# Patient Record
Sex: Female | Born: 1995 | Race: White | Hispanic: No | Marital: Single | Smoking: Never smoker
Health system: Southern US, Community
[De-identification: ages and names within clinical notes are randomized; demographics above are authoritative.]

## PROBLEM LIST (undated history)

## (undated) DIAGNOSIS — Z973 Presence of spectacles and contact lenses: Secondary | ICD-10-CM

## (undated) HISTORY — PX: NO PAST SURGERIES: SHX2092

---

## 2015-08-09 ENCOUNTER — Emergency Department (HOSPITAL_COMMUNITY): Payer: No Typology Code available for payment source

## 2015-08-09 ENCOUNTER — Emergency Department (HOSPITAL_COMMUNITY)
Admission: EM | Admit: 2015-08-09 | Discharge: 2015-08-09 | Disposition: A | Discharge status: Home / Self Care | Payer: No Typology Code available for payment source | Attending: Emergency Medicine | Admitting: Emergency Medicine

## 2015-08-09 ENCOUNTER — Encounter (HOSPITAL_COMMUNITY): Payer: Self-pay | Admitting: Emergency Medicine

## 2015-08-09 DIAGNOSIS — R0781 Pleurodynia: Secondary | ICD-10-CM

## 2015-08-09 DIAGNOSIS — Z880 Allergy status to penicillin: Secondary | ICD-10-CM | POA: Insufficient documentation

## 2015-08-09 DIAGNOSIS — Y9389 Activity, other specified: Secondary | ICD-10-CM | POA: Diagnosis not present

## 2015-08-09 DIAGNOSIS — S29001A Unspecified injury of muscle and tendon of front wall of thorax, initial encounter: Secondary | ICD-10-CM | POA: Diagnosis present

## 2015-08-09 DIAGNOSIS — Y998 Other external cause status: Secondary | ICD-10-CM | POA: Diagnosis not present

## 2015-08-09 DIAGNOSIS — Z79899 Other long term (current) drug therapy: Secondary | ICD-10-CM | POA: Insufficient documentation

## 2015-08-09 DIAGNOSIS — Y92410 Unspecified street and highway as the place of occurrence of the external cause: Secondary | ICD-10-CM | POA: Diagnosis not present

## 2015-08-09 MED ORDER — IBUPROFEN 800 MG PO TABS: 800.0000 mg | ORAL_TABLET | Freq: Three times a day (TID) | ORAL | Status: DC

## 2015-08-09 MED ORDER — METHOCARBAMOL 500 MG PO TABS: 500.0000 mg | ORAL_TABLET | Freq: Two times a day (BID) | ORAL | Status: DC | PRN
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
## 2017-12-13 ENCOUNTER — Emergency Department (HOSPITAL_COMMUNITY): Payer: Managed Care, Other (non HMO)

## 2017-12-13 ENCOUNTER — Emergency Department (HOSPITAL_COMMUNITY)
Admission: EM | Admit: 2017-12-13 | Discharge: 2017-12-13 | Disposition: A | Discharge status: Home / Self Care | Payer: Managed Care, Other (non HMO) | Attending: Emergency Medicine | Admitting: Emergency Medicine

## 2017-12-13 ENCOUNTER — Encounter (HOSPITAL_COMMUNITY): Payer: Self-pay | Admitting: Emergency Medicine

## 2017-12-13 ENCOUNTER — Other Ambulatory Visit: Payer: Self-pay

## 2017-12-13 DIAGNOSIS — Y92410 Unspecified street and highway as the place of occurrence of the external cause: Secondary | ICD-10-CM | POA: Diagnosis not present

## 2017-12-13 DIAGNOSIS — Z79899 Other long term (current) drug therapy: Secondary | ICD-10-CM | POA: Insufficient documentation

## 2017-12-13 DIAGNOSIS — S161XXA Strain of muscle, fascia and tendon at neck level, initial encounter: Secondary | ICD-10-CM | POA: Diagnosis not present

## 2017-12-13 DIAGNOSIS — Y9389 Activity, other specified: Secondary | ICD-10-CM | POA: Diagnosis not present

## 2017-12-13 DIAGNOSIS — S1980XA Other specified injuries of unspecified part of neck, initial encounter: Secondary | ICD-10-CM | POA: Diagnosis present

## 2017-12-13 DIAGNOSIS — Y999 Unspecified external cause status: Secondary | ICD-10-CM | POA: Insufficient documentation

## 2017-12-13 MED ORDER — METHOCARBAMOL 500 MG PO TABS: 500.0000 mg | ORAL_TABLET | Freq: Two times a day (BID) | ORAL | 0 refills | Status: DC

## 2017-12-13 MED ORDER — ACETAMINOPHEN 500 MG PO TABS: 500.0000 mg | ORAL_TABLET | Freq: Four times a day (QID) | ORAL | 0 refills | Status: DC | PRN

## 2017-12-13 MED ORDER — NAPROXEN 500 MG PO TABS: 500.0000 mg | ORAL_TABLET | Freq: Two times a day (BID) | ORAL | 0 refills | Status: DC
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
## 2020-03-13 DIAGNOSIS — Z20828 Contact with and (suspected) exposure to other viral communicable diseases: Secondary | ICD-10-CM | POA: Diagnosis not present

## 2020-03-13 DIAGNOSIS — J028 Acute pharyngitis due to other specified organisms: Secondary | ICD-10-CM | POA: Diagnosis not present

## 2020-03-13 DIAGNOSIS — J029 Acute pharyngitis, unspecified: Secondary | ICD-10-CM | POA: Diagnosis not present

## 2020-03-13 DIAGNOSIS — B9689 Other specified bacterial agents as the cause of diseases classified elsewhere: Secondary | ICD-10-CM | POA: Diagnosis not present

## 2021-04-07 ENCOUNTER — Other Ambulatory Visit: Payer: Self-pay

## 2021-04-07 ENCOUNTER — Encounter (HOSPITAL_BASED_OUTPATIENT_CLINIC_OR_DEPARTMENT_OTHER): Payer: Self-pay | Admitting: Obstetrics

## 2021-04-07 NOTE — Progress Notes (Signed)
Spoke w/ via phone for pre-op interview--- pt Lab needs dos----   cbc, urine preg, t&s            Lab results------ no COVID test -----patient states asymptomatic no test needed Arrive at ------- 1230 on 04-10-2021 NPO after MN NO Solid Food.  Clear liquids from MN until--- 1130 Med rec completed Medications to take morning of surgery ----- none Diabetic medication ----- n/a Patient instructed no nail polish to be worn day of surgery Patient instructed to bring photo id and insurance card day of surgery Patient aware to have Driver (ride ) / caregiver for 24 hours after surgery --mother, deborah Oriol Patient Special Instructions ----- n/a Pre-Op special Istructions ----- n/a Patient verbalized understanding of instructions that were given at this phone interview. Patient denies shortness of breath, chest pain, fever, cough at this phone interview.

## 2021-04-10 ENCOUNTER — Ambulatory Visit (HOSPITAL_BASED_OUTPATIENT_CLINIC_OR_DEPARTMENT_OTHER): Payer: Managed Care, Other (non HMO) | Admitting: Anesthesiology

## 2021-04-10 ENCOUNTER — Encounter (HOSPITAL_BASED_OUTPATIENT_CLINIC_OR_DEPARTMENT_OTHER)
Admission: RE | Disposition: A | Discharge status: Home / Self Care | Payer: Self-pay | Source: Home / Self Care | Attending: Obstetrics

## 2021-04-10 ENCOUNTER — Ambulatory Visit (HOSPITAL_BASED_OUTPATIENT_CLINIC_OR_DEPARTMENT_OTHER)
Admission: RE | Admit: 2021-04-10 | Discharge: 2021-04-10 | Disposition: A | Discharge status: Home / Self Care | Payer: Managed Care, Other (non HMO) | Attending: Obstetrics | Admitting: Obstetrics

## 2021-04-10 ENCOUNTER — Encounter (HOSPITAL_BASED_OUTPATIENT_CLINIC_OR_DEPARTMENT_OTHER): Payer: Self-pay | Admitting: Obstetrics

## 2021-04-10 DIAGNOSIS — Z302 Encounter for sterilization: Secondary | ICD-10-CM | POA: Insufficient documentation

## 2021-04-10 HISTORY — DX: Presence of spectacles and contact lenses: Z97.3

## 2021-04-10 HISTORY — PX: LAPAROSCOPIC TUBAL LIGATION: SHX1937

## 2021-04-10 LAB — CBC
HCT: 39.5 % (ref 36.0–46.0)
Hemoglobin: 12.9 g/dL (ref 12.0–15.0)
MCH: 31.2 pg (ref 26.0–34.0)
MCHC: 32.7 g/dL (ref 30.0–36.0)
MCV: 95.4 fL (ref 80.0–100.0)
Platelets: 239 10*3/uL (ref 150–400)
RBC: 4.14 MIL/uL (ref 3.87–5.11)
RDW: 12.7 % (ref 11.5–15.5)
WBC: 10.8 10*3/uL — ABNORMAL HIGH (ref 4.0–10.5)
nRBC: 0 % (ref 0.0–0.2)

## 2021-04-10 LAB — ABO/RH: ABO/RH(D): O POS

## 2021-04-10 LAB — TYPE AND SCREEN
ABO/RH(D): O POS
Antibody Screen: NEGATIVE

## 2021-04-10 LAB — POCT PREGNANCY, URINE: Preg Test, Ur: NEGATIVE

## 2021-04-10 SURGERY — LIGATION, FALLOPIAN TUBE, LAPAROSCOPIC
Anesthesia: General | Site: Uterus | Laterality: Bilateral

## 2021-04-10 MED ORDER — ONDANSETRON HCL 4 MG/2ML IJ SOLN
INTRAMUSCULAR | Status: AC
  Filled 2021-04-10: qty 2

## 2021-04-10 MED ORDER — KETOROLAC TROMETHAMINE 30 MG/ML IJ SOLN
INTRAMUSCULAR | Status: DC | PRN
  Administered 2021-04-10: 30 mg via INTRAVENOUS

## 2021-04-10 MED ORDER — LIDOCAINE HCL (PF) 2 % IJ SOLN
INTRAMUSCULAR | Status: AC
  Filled 2021-04-10: qty 5

## 2021-04-10 MED ORDER — SUGAMMADEX SODIUM 200 MG/2ML IV SOLN
INTRAVENOUS | Status: DC | PRN
  Administered 2021-04-10: 139.2 mg via INTRAVENOUS

## 2021-04-10 MED ORDER — SCOPOLAMINE 1 MG/3DAYS TD PT72
MEDICATED_PATCH | TRANSDERMAL | Status: AC
  Filled 2021-04-10: qty 1

## 2021-04-10 MED ORDER — FENTANYL CITRATE (PF) 100 MCG/2ML IJ SOLN
25.0000 ug | INTRAMUSCULAR | Status: DC | PRN
  Administered 2021-04-10 (×2): 25 ug via INTRAVENOUS

## 2021-04-10 MED ORDER — MIDAZOLAM HCL 2 MG/2ML IJ SOLN
INTRAMUSCULAR | Status: AC
  Filled 2021-04-10: qty 2

## 2021-04-10 MED ORDER — ONDANSETRON HCL 4 MG/2ML IJ SOLN
INTRAMUSCULAR | Status: DC | PRN
  Administered 2021-04-10: 4 mg via INTRAVENOUS

## 2021-04-10 MED ORDER — LIDOCAINE 2% (20 MG/ML) 5 ML SYRINGE
INTRAMUSCULAR | Status: DC | PRN
  Administered 2021-04-10: 60 mg via INTRAVENOUS

## 2021-04-10 MED ORDER — OXYCODONE HCL 5 MG/5ML PO SOLN
5.0000 mg | Freq: Once | ORAL | Status: DC | PRN
Start: 2021-04-10 — End: 2021-04-10

## 2021-04-10 MED ORDER — PROMETHAZINE HCL 25 MG/ML IJ SOLN
6.2500 mg | INTRAMUSCULAR | Status: DC | PRN
  Administered 2021-04-10: 6.25 mg via INTRAVENOUS

## 2021-04-10 MED ORDER — EPHEDRINE 5 MG/ML INJ
INTRAVENOUS | Status: AC
  Filled 2021-04-10: qty 5

## 2021-04-10 MED ORDER — PROMETHAZINE HCL 25 MG/ML IJ SOLN
INTRAMUSCULAR | Status: AC
  Filled 2021-04-10: qty 1

## 2021-04-10 MED ORDER — DEXAMETHASONE SODIUM PHOSPHATE 10 MG/ML IJ SOLN
INTRAMUSCULAR | Status: AC
  Filled 2021-04-10: qty 1

## 2021-04-10 MED ORDER — FENTANYL CITRATE (PF) 100 MCG/2ML IJ SOLN
INTRAMUSCULAR | Status: AC
  Filled 2021-04-10: qty 2

## 2021-04-10 MED ORDER — DROPERIDOL 2.5 MG/ML IJ SOLN
INTRAMUSCULAR | Status: AC
  Filled 2021-04-10: qty 2

## 2021-04-10 MED ORDER — ROCURONIUM BROMIDE 10 MG/ML (PF) SYRINGE
PREFILLED_SYRINGE | INTRAVENOUS | Status: DC | PRN
  Administered 2021-04-10: 50 mg via INTRAVENOUS

## 2021-04-10 MED ORDER — SCOPOLAMINE 1 MG/3DAYS TD PT72
1.0000 | MEDICATED_PATCH | TRANSDERMAL | Status: DC
  Administered 2021-04-10: 1.5 mg via TRANSDERMAL

## 2021-04-10 MED ORDER — 0.9 % SODIUM CHLORIDE (POUR BTL) OPTIME
TOPICAL | Status: DC | PRN
  Administered 2021-04-10: 500 mL

## 2021-04-10 MED ORDER — PROPOFOL 10 MG/ML IV BOLUS
INTRAVENOUS | Status: AC
  Filled 2021-04-10: qty 20

## 2021-04-10 MED ORDER — MIDAZOLAM HCL 5 MG/5ML IJ SOLN
INTRAMUSCULAR | Status: DC | PRN
  Administered 2021-04-10: 2 mg via INTRAVENOUS

## 2021-04-10 MED ORDER — OXYCODONE HCL 5 MG PO TABS: 5.0000 mg | ORAL_TABLET | Freq: Once | ORAL | Status: DC | PRN

## 2021-04-10 MED ORDER — ACETAMINOPHEN 500 MG PO TABS
ORAL_TABLET | ORAL | Status: AC
  Filled 2021-04-10: qty 2

## 2021-04-10 MED ORDER — OXYCODONE HCL 5 MG PO TABS: 5.0000 mg | ORAL_TABLET | Freq: Four times a day (QID) | ORAL | 0 refills | Status: DC | PRN

## 2021-04-10 MED ORDER — ACETAMINOPHEN 500 MG PO TABS
1000.0000 mg | ORAL_TABLET | Freq: Once | ORAL | Status: AC
  Administered 2021-04-10: 1000 mg via ORAL

## 2021-04-10 MED ORDER — FENTANYL CITRATE (PF) 250 MCG/5ML IJ SOLN
INTRAMUSCULAR | Status: DC | PRN
  Administered 2021-04-10: 50 ug via INTRAVENOUS
  Administered 2021-04-10 (×2): 25 ug via INTRAVENOUS

## 2021-04-10 MED ORDER — DEXAMETHASONE SODIUM PHOSPHATE 10 MG/ML IJ SOLN
INTRAMUSCULAR | Status: DC | PRN
  Administered 2021-04-10: 10 mg via INTRAVENOUS

## 2021-04-10 MED ORDER — DROPERIDOL 2.5 MG/ML IJ SOLN
0.6250 mg | Freq: Once | INTRAMUSCULAR | Status: AC | PRN
  Administered 2021-04-10: 0.625 mg via INTRAVENOUS

## 2021-04-10 MED ORDER — LACTATED RINGERS IV SOLN: INTRAVENOUS | Status: DC

## 2021-04-10 MED ORDER — PROPOFOL 10 MG/ML IV BOLUS
INTRAVENOUS | Status: DC | PRN
  Administered 2021-04-10: 40 mg via INTRAVENOUS
  Administered 2021-04-10: 160 mg via INTRAVENOUS

## 2021-04-10 MED ORDER — BUPIVACAINE HCL (PF) 0.25 % IJ SOLN
INTRAMUSCULAR | Status: DC | PRN
  Administered 2021-04-10: 10 mL

## 2021-04-10 SURGICAL SUPPLY — 24 items
APL PRP STRL LF DISP 70% ISPRP (MISCELLANEOUS) ×1
CATH ROBINSON RED A/P 16FR (CATHETERS) ×2 IMPLANT
CHLORAPREP W/TINT 26 (MISCELLANEOUS) ×2 IMPLANT
CLIP FILSHIE TUBAL LIGA STRL (Clip) ×1 IMPLANT
COVER MAYO STAND STRL (DRAPES) ×2 IMPLANT
DRSG COVADERM PLUS 2X2 (GAUZE/BANDAGES/DRESSINGS) ×1 IMPLANT
DRSG OPSITE POSTOP 3X4 (GAUZE/BANDAGES/DRESSINGS) ×2 IMPLANT
FILTER ARTHROSCOPY CONVERTOR (FILTER) ×2 IMPLANT
GAUZE 4X4 16PLY ~~LOC~~+RFID DBL (SPONGE) ×4 IMPLANT
GLOVE SURG LTX SZ6 (GLOVE) ×2 IMPLANT
GLOVE SURG UNDER POLY LF SZ6 (GLOVE) ×4 IMPLANT
KIT TURNOVER CYSTO (KITS) ×2 IMPLANT
NS IRRIG 1000ML POUR BTL (IV SOLUTION) ×2 IMPLANT
PACK LAPAROSCOPY BASIN (CUSTOM PROCEDURE TRAY) ×2 IMPLANT
PACK TRENDGUARD 450 HYBRID PRO (MISCELLANEOUS) ×1 IMPLANT
PROTECTOR NERVE ULNAR (MISCELLANEOUS) ×4 IMPLANT
SET TUBE SMOKE EVAC HIGH FLOW (TUBING) ×2 IMPLANT
SUT VICRYL 0 UR6 27IN ABS (SUTURE) ×2 IMPLANT
SUT VICRYL RAPIDE 4/0 PS 2 (SUTURE) ×2 IMPLANT
TOWEL OR 17X26 10 PK STRL BLUE (TOWEL DISPOSABLE) ×2 IMPLANT
TRENDGUARD 450 HYBRID PRO PACK (MISCELLANEOUS) ×2
TROCAR BLADELESS OPT 5 100 (ENDOMECHANICALS) IMPLANT
TROCAR XCEL NON-BLD 11X100MML (ENDOMECHANICALS) ×2 IMPLANT
WARMER LAPAROSCOPE (MISCELLANEOUS) ×2 IMPLANT

## 2021-04-10 NOTE — Anesthesia Preprocedure Evaluation (Addendum)
Anesthesia Evaluation  Patient identified by MRN, date of birth, ID band Patient awake    Reviewed: Allergy & Precautions, NPO status , Patient's Chart, lab work & pertinent test results  Airway Mallampati: I  TM Distance: >3 FB Neck ROM: Full    Dental no notable dental hx.    Pulmonary neg pulmonary ROS,    Pulmonary exam normal breath sounds clear to auscultation       Cardiovascular negative cardio ROS Normal cardiovascular exam Rhythm:Regular Rate:Normal     Neuro/Psych negative neurological ROS  negative psych ROS   GI/Hepatic negative GI ROS, Neg liver ROS,   Endo/Other  negative endocrine ROS  Renal/GU negative Renal ROS  negative genitourinary   Musculoskeletal negative musculoskeletal ROS (+)   Abdominal   Peds negative pediatric ROS (+)  Hematology negative hematology ROS (+)   Anesthesia Other Findings   Reproductive/Obstetrics negative OB ROS                            Anesthesia Physical Anesthesia Plan  ASA: 2  Anesthesia Plan: General   Post-op Pain Management:    Induction: Intravenous  PONV Risk Score and Plan: Scopolamine patch - Pre-op, Treatment may vary due to age or medical condition, Midazolam, Ondansetron and Dexamethasone  Airway Management Planned: Oral ETT  Additional Equipment: None  Intra-op Plan:   Post-operative Plan: Extubation in OR  Informed Consent: I have reviewed the patients History and Physical, chart, labs and discussed the procedure including the risks, benefits and alternatives for the proposed anesthesia with the patient or authorized representative who has indicated his/her understanding and acceptance.     Dental advisory given  Plan Discussed with: CRNA and Anesthesiologist  Anesthesia Plan Comments:        Anesthesia Quick Evaluation

## 2021-04-10 NOTE — Discharge Instructions (Addendum)
No acetaminophen/Tylenol until after 7:00pm today if needed.  No ibuprofen, Advil, Aleve, Motrin, ketorolac, meloxicam, or naproxen until after 10:00pm today if needed.   DISCHARGE INSTRUCTIONS: Laparoscopy  The following instructions have been prepared to help you care for yourself upon your return home today.  Wound care:  Do not get the incision wet for the first 24 hours. The incision should be kept clean and dry.  The Band-Aids or dressings may be removed the day after surgery.  Should the incision become sore, red, and swollen after the first week, check with your doctor.  Personal hygiene:  Shower the day after your procedure.  Activity and limitations:  Do NOT drive or operate any equipment today.  Do NOT lift anything more than 15 pounds for 2-3 weeks after surgery.  Do NOT rest in bed all day.  Walking is encouraged. Walk each day, starting slowly with 5-minute walks 3 or 4 times a day. Slowly increase the length of your walks.  Walk up and down stairs slowly.  Do NOT do strenuous activities, such as golfing, playing tennis, bowling, running, biking, weight lifting, gardening, mowing, or vacuuming for 2-4 weeks. Ask your doctor when it is okay to start.  Diet: Eat a light meal as desired this evening. You may resume your usual diet tomorrow.  Return to work: This is dependent on the type of work you do. For the most part you can return to a desk job within a week of surgery. If you are more active at work, please discuss this with your doctor.  What to expect after your surgery: You may have a slight burning sensation when you urinate on the first day. You may have a very small amount of blood in the urine. Expect to have a small amount of vaginal discharge/light bleeding for 1-2 weeks. It is not unusual to have abdominal soreness and bruising for up to 2 weeks. You may be tired and need more rest for about 1 week. You may experience shoulder pain for 24-72 hours. Lying flat in  bed may relieve it.  Call your doctor for any of the following:  Develop a fever of 100.4 or greater  Inability to urinate 6 hours after discharge from hospital  Severe pain not relieved by pain medications  Persistent of heavy bleeding at incision site  Redness or swelling around incision site after a week  Increasing nausea or vomiting      Post Anesthesia Home Care Instructions  Activity: Get plenty of rest for the remainder of the day. A responsible individual must stay with you for 24 hours following the procedure.  For the next 24 hours, DO NOT: -Drive a car -Advertising copywriter -Drink alcoholic beverages -Take any medication unless instructed by your physician -Make any legal decisions or sign important papers.  Meals: Start with liquid foods such as gelatin or soup. Progress to regular foods as tolerated. Avoid greasy, spicy, heavy foods. If nausea and/or vomiting occur, drink only clear liquids until the nausea and/or vomiting subsides. Call your physician if vomiting continues.  Special Instructions/Symptoms: Your throat may feel dry or sore from the anesthesia or the breathing tube placed in your throat during surgery. If this causes discomfort, gargle with warm salt water. The discomfort should disappear within 24 hours.  If you had a scopolamine patch placed behind your ear for the management of post- operative nausea and/or vomiting:  1. The medication in the patch is effective for 72 hours, after which it should  be removed.  Wrap patch in a tissue and discard in the trash. Wash hands thoroughly with soap and water. 2. You may remove the patch earlier than 72 hours if you experience unpleasant side effects which may include dry mouth, dizziness or visual disturbances. 3. Avoid touching the patch. Wash your hands with soap and water after contact with the patch.

## 2021-04-10 NOTE — Op Note (Signed)
Pre-Operative Diagnosis: desires permanent sterilization  Postoperative Diagnosis: Same  Procedure: Laparoscopic bilateral tubal ligation with Filshie clips  Surgeon: Marlow Baars, MD  Anesthesia: General endotracheal anesthesia and 10 cc of 0.25% Marcaine injected infraumbilically.  Operative Findings:Normal appearing ovaries, tubes, and uterus.   Specimen:None  EQA:[id number]  Following the appropriate informed consent the patient was taken to the operating room. She was placed in dorsal lithotomy position, and general anesthesia was administered. The abdomen, perineum, and vagina, were prepped in the normal sterile fashion.  A speculum was placed in the vagina and an acorn uterine manipulator was placed transcervically. The speculum was removed from the vagina. Attention was then turned to the abdominal portion of the case. Following the appropriate draping, 0.25% Marcaine was injected infraumbilically. An incision was made at the base of the umbilicus with a scalpel. The 10/11 mm Optiview trocar was used to enter the abdomen under direct visualization. Entry was confirmed and the abdomen was insufflated with  CO2 gas.  The intra-abdominal cavity was inspected and findings were as above. The uterus was elevated and the patient was placed in Trendelenburg. The operative scope was then introduced into the abdomen with the Filshie clip applicator. The right fallopian tube was identified and a Filshie clip was applied circumferentially to the fallopian tube. The same procedure was repeated on the left side.  Both tubes were examined again and the Filshie clips completely encircled each tube. This completed the procedure. The abdomen was desufflated, the ports were removed, and, the fascia was closed with 0-Vicryl in with a figure of eight stitch.  The skin was closed in a subcuticular fashion with 4-0 vicryl.  A total of 10 mL of 0.25% marcaine was injected at the umbilicus. The uterine manipulator was  removed from the vagina. All sponge, lap, and needle counts were correct x2.  The patient tolerated the procedure well and was brought to the recovery room in stable condition

## 2021-04-10 NOTE — Transfer of Care (Signed)
Immediate Anesthesia Transfer of Care Note  Patient: Demetria R Job  Procedure(s) Performed: LAPAROSCOPIC TUBAL LIGATION (Bilateral: Uterus)  Patient Location: PACU  Anesthesia Type:General  Level of Consciousness: awake, alert , oriented and patient cooperative  Airway & Oxygen Therapy: Patient Spontanous Breathing  Post-op Assessment: Report given to RN and Post -op Vital signs reviewed and stable  Post vital signs: Reviewed and stable  Last Vitals:  Vitals Value Taken Time  BP 104/55 04/10/21 1630  Temp 36.4 C 04/10/21 1626  Pulse 80 04/10/21 1635  Resp 27 04/10/21 1635  SpO2 99 % 04/10/21 1635  Vitals shown include unvalidated device data.  Last Pain:  Vitals:   04/10/21 1626  TempSrc:   PainSc: 5       Patients Stated Pain Goal: 3 (04/10/21 1255)  Complications: No notable events documented.

## 2021-04-10 NOTE — H&P (Signed)
25 y.o. G0 presents for permanent sterilization via laparoscopic bilateral tubal ligation.   Past Medical History:  Diagnosis Date   Wears contact lenses     Past Surgical History:  Procedure Laterality Date   NO PAST SURGERIES      OB History  No obstetric history on file.    Social History   Socioeconomic History   Marital status: Single    Spouse name: Not on file   Number of children: Not on file   Years of education: Not on file   Highest education level: Not on file  Occupational History   Not on file  Tobacco Use   Smoking status: Never   Smokeless tobacco: Never  Vaping Use   Vaping Use: Never used  Substance and Sexual Activity   Alcohol use: Yes    Comment: occasional   Drug use: Never   Sexual activity: Yes    Birth control/protection: None  Other Topics Concern   Not on file  Social History Narrative   Not on file   Social Determinants of Health   Financial Resource Strain: Not on file  Food Insecurity: Not on file  Transportation Needs: Not on file  Physical Activity: Not on file  Stress: Not on file  Social Connections: Not on file  Intimate Partner Violence: Not on file   Penicillins    Vitals:   04/10/21 1255  BP: 105/66  Pulse: 71  Resp: 14  Temp: 98.3 F (36.8 C)  SpO2: 99%     General:  NAD Lungs: CTAB Cardiac: RRR Abdomen:  Soft Ex:  no edema    A/P   25 y.o. presents for permanent sterilization via laparoscopic bilateral tubal ligation.  Discussed in detail risk, benefits, alternatives to procedures.  She understands the risks to include infection, bleeding, damage to surrounding structures (including but not limited to bowel, bladder, ovaries, nerves, vessels), tubal failure resulting in pregnancy, including risk of ectopic pregnancy, need for additional procedures, risk of regret.  Desires l/s BTL with filshie clips as she prefers to minimize number of incisions in abdomen. If for any reason there are adhesions that would  prevent placement of clip, would desire to proceed with salpingectomy if needed to ensure sterilization is completed.    She understands and elects to proceed.  Consent signed.  UPT negative in holding.    [facility] GEFFEL [employer]

## 2021-04-10 NOTE — Anesthesia Postprocedure Evaluation (Signed)
Anesthesia Post Note  Patient: Soyla R Huot  Procedure(s) Performed: LAPAROSCOPIC TUBAL LIGATION (Bilateral: Uterus)     Patient location during evaluation: PACU Anesthesia Type: General Level of consciousness: awake and alert and oriented Pain management: pain level controlled Vital Signs Assessment: post-procedure vital signs reviewed and stable Respiratory status: spontaneous breathing, nonlabored ventilation and respiratory function stable Cardiovascular status: blood pressure returned to baseline and stable Postop Assessment: no apparent nausea or vomiting Anesthetic complications: no   No notable events documented.  Last Vitals:  Vitals:   04/10/21 1715 04/10/21 1727  BP: 115/67 118/78  Pulse: 78 72  Resp: (!) 25 (!) 24  Temp: (!) 36.4 C (!) 36.4 C  SpO2: 99% 100%    Last Pain:  Vitals:   04/10/21 1655  TempSrc:   PainSc: 4                  Telesha Deguzman A.

## 2021-04-10 NOTE — Anesthesia Procedure Notes (Addendum)
Procedure Name: Intubation Date/Time: 04/10/2021 3:22 PM Performed by: Iain Sawchuk D, CRNA Pre-anesthesia Checklist: Patient identified, Emergency Drugs available, Suction available and Patient being monitored Patient Re-evaluated:Patient Re-evaluated prior to induction Oxygen Delivery Method: Circle system utilized Preoxygenation: Pre-oxygenation with 100% oxygen Induction Type: IV induction Ventilation: Mask ventilation without difficulty Laryngoscope Size: Mac and 3 Grade View: Grade I Tube type: Oral Number of attempts: 1 Airway Equipment and Method: Stylet Placement Confirmation: ETT inserted through vocal cords under direct vision, positive ETCO2 and breath sounds checked- equal and bilateral Secured at: 21 cm Tube secured with: Tape Dental Injury: Teeth and Oropharynx as per pre-operative assessment

## 2021-04-11 ENCOUNTER — Encounter (HOSPITAL_BASED_OUTPATIENT_CLINIC_OR_DEPARTMENT_OTHER): Payer: Self-pay | Admitting: Obstetrics

## 2021-04-14 NOTE — Addendum Note (Signed)
Addendum  created 04/14/21 1236 by Francie Massing, CRNA   Charge Capture section accepted

## 2021-12-19 DIAGNOSIS — S8991XA Unspecified injury of right lower leg, initial encounter: Secondary | ICD-10-CM | POA: Diagnosis not present

## 2021-12-19 DIAGNOSIS — Z6825 Body mass index (BMI) 25.0-25.9, adult: Secondary | ICD-10-CM | POA: Diagnosis not present

## 2021-12-19 DIAGNOSIS — F339 Major depressive disorder, recurrent, unspecified: Secondary | ICD-10-CM | POA: Diagnosis not present

## 2021-12-23 DIAGNOSIS — S83241A Other tear of medial meniscus, current injury, right knee, initial encounter: Secondary | ICD-10-CM | POA: Diagnosis not present

## 2022-01-16 DIAGNOSIS — M25561 Pain in right knee: Secondary | ICD-10-CM | POA: Diagnosis not present

## 2022-01-27 DIAGNOSIS — M6751 Plica syndrome, right knee: Secondary | ICD-10-CM | POA: Diagnosis not present

## 2022-01-30 DIAGNOSIS — Z13220 Encounter for screening for lipoid disorders: Secondary | ICD-10-CM | POA: Diagnosis not present

## 2022-01-30 DIAGNOSIS — Z6825 Body mass index (BMI) 25.0-25.9, adult: Secondary | ICD-10-CM | POA: Diagnosis not present

## 2022-01-30 DIAGNOSIS — Z0000 Encounter for general adult medical examination without abnormal findings: Secondary | ICD-10-CM | POA: Diagnosis not present

## 2022-02-17 DIAGNOSIS — M6751 Plica syndrome, right knee: Secondary | ICD-10-CM | POA: Diagnosis not present

## 2022-03-17 DIAGNOSIS — J329 Chronic sinusitis, unspecified: Secondary | ICD-10-CM | POA: Diagnosis not present

## 2022-03-17 DIAGNOSIS — J40 Bronchitis, not specified as acute or chronic: Secondary | ICD-10-CM | POA: Diagnosis not present

## 2022-03-25 DIAGNOSIS — R051 Acute cough: Secondary | ICD-10-CM | POA: Diagnosis not present

## 2022-03-25 DIAGNOSIS — R0602 Shortness of breath: Secondary | ICD-10-CM | POA: Diagnosis not present

## 2022-05-01 DIAGNOSIS — Z6825 Body mass index (BMI) 25.0-25.9, adult: Secondary | ICD-10-CM | POA: Diagnosis not present

## 2022-05-01 DIAGNOSIS — F339 Major depressive disorder, recurrent, unspecified: Secondary | ICD-10-CM | POA: Diagnosis not present

## 2022-05-01 DIAGNOSIS — Z23 Encounter for immunization: Secondary | ICD-10-CM | POA: Diagnosis not present

## 2023-07-08 DIAGNOSIS — R0981 Nasal congestion: Secondary | ICD-10-CM | POA: Diagnosis not present

## 2024-02-20 ENCOUNTER — Encounter (HOSPITAL_BASED_OUTPATIENT_CLINIC_OR_DEPARTMENT_OTHER): Payer: Self-pay

## 2024-02-20 ENCOUNTER — Ambulatory Visit (HOSPITAL_BASED_OUTPATIENT_CLINIC_OR_DEPARTMENT_OTHER)
Admission: EM | Admit: 2024-02-20 | Discharge: 2024-02-20 | Disposition: A | Discharge status: Home / Self Care | Attending: Family Medicine | Admitting: Family Medicine

## 2024-02-20 DIAGNOSIS — J0140 Acute pansinusitis, unspecified: Secondary | ICD-10-CM | POA: Diagnosis not present

## 2024-02-20 DIAGNOSIS — J3489 Other specified disorders of nose and nasal sinuses: Secondary | ICD-10-CM

## 2024-02-20 MED ORDER — FLUCONAZOLE 150 MG PO TABS: ORAL_TABLET | ORAL | 0 refills | Status: DC

## 2024-02-20 MED ORDER — DOXYCYCLINE HYCLATE 100 MG PO CAPS: 100.0000 mg | ORAL_CAPSULE | Freq: Two times a day (BID) | ORAL | 0 refills | Status: AC

## 2024-02-20 MED ORDER — FLUTICASONE PROPIONATE 50 MCG/ACT NA SUSP: 1.0000 | Freq: Two times a day (BID) | NASAL | 0 refills | Status: DC | PRN

## 2024-02-20 NOTE — ED Provider Notes (Signed)
 [name]    CSN: [id number] Arrival date & time: 02/20/24  1437      History   Chief Complaint Chief Complaint  Patient presents with   Cough   Sinusitis    HPI Kari Greer is a 28 y.o. female.   28 year old female reporting nasal congestion, sinus pressure and discolored nasal discharge since 02/18/2024 or earlier.  Every time she blows her nose for the last 24 hours it is coming out dark brownish mucus with blood in it.  She is having facial pressure and pain and think she has a sinus infection.  She denies fever, nausea, vomiting, constipation, diarrhea.   Cough Associated symptoms: rhinorrhea   Associated symptoms: no chest pain, no chills, no ear pain, no fever, no rash, no shortness of breath and no sore throat   Sinusitis Associated symptoms: congestion, cough and rhinorrhea   Associated symptoms: no chest pain, no chills, no ear pain, no fever, no nausea, no shortness of breath, no sore throat and no vomiting     Past Medical History:  Diagnosis Date   Wears contact lenses     There are no active problems to display for this patient.   Past Surgical History:  Procedure Laterality Date   LAPAROSCOPIC TUBAL LIGATION Bilateral 04/10/2021   Procedure: LAPAROSCOPIC TUBAL LIGATION;  Surgeon: Gretta Gums, MD;  Location: [facility] [place];  Service: Gynecology;  Laterality: Bilateral;  with filshie clips   NO PAST SURGERIES      OB History   No obstetric history on file.      Home Medications    Prior to Admission medications   Medication Sig Start Date End Date Taking? Authorizing Provider  doxycycline  (VIBRAMYCIN ) 100 MG capsule Take 1 capsule (100 mg total) by mouth 2 (two) times daily for 10 days. 02/20/24 03/01/24 Yes Ival Domino, FNP  fluconazole  (DIFLUCAN ) 150 MG tablet By mouth today and repeat every 5 days for vaginal yeast infection.  May use up to four pills, if needed 02/20/24  Yes Ival Domino, FNP  fluticasone  (FLONASE )  50 MCG/ACT nasal spray Place 1 spray into both nostrils 2 (two) times daily as needed for rhinitis. 02/20/24 03/21/24 Yes Ival Domino, FNP  buPROPion (WELLBUTRIN XL) 150 MG 24 hr tablet Take 150 mg by mouth at bedtime.    [provider]  loratadine (CLARITIN) 10 MG tablet Take 10 mg by mouth daily as needed for allergies.    [provider]  oxyCODONE  (ROXICODONE ) 5 MG immediate release tablet Take 1 tablet (5 mg total) by mouth every 6 (six) hours as needed for severe pain. 04/10/21   Gretta Gums, MD    Family History History reviewed. No pertinent family history.  Social History Social History   Tobacco Use   Smoking status: Never   Smokeless tobacco: Never  Vaping Use   Vaping status: Never Used  Substance Use Topics   Alcohol use: Yes    Comment: occasional   Drug use: Never     Allergies   Penicillins   Review of Systems Review of Systems  Constitutional:  Negative for chills and fever.  HENT:  Positive for congestion, postnasal drip, rhinorrhea, sinus pressure and sinus pain. Negative for ear pain, nosebleeds (She is not having nosebleeds but she is having blood-tinged mucus coming out of her nose when she blows her nose.) and sore throat.   Eyes:  Negative for pain and visual disturbance.  Respiratory:  Positive for cough. Negative for shortness  of breath.   Cardiovascular:  Negative for chest pain and palpitations.  Gastrointestinal:  Negative for abdominal pain, constipation, diarrhea, nausea and vomiting.  Genitourinary:  Negative for dysuria and hematuria.  Musculoskeletal:  Negative for arthralgias and back pain.  Skin:  Negative for color change and rash.  Neurological:  Negative for seizures and syncope.  All other systems reviewed and are negative.    Physical Exam Triage Vital Signs ED Triage Vitals  Encounter Vitals Group     BP 02/20/24 1450 114/72     Girls Systolic BP Percentile --      Girls Diastolic BP Percentile --       Boys Systolic BP Percentile --      Boys Diastolic BP Percentile --      Pulse Rate 02/20/24 1450 71     Resp 02/20/24 1450 16     Temp 02/20/24 1450 99 F (37.2 C)     Temp Source 02/20/24 1450 Oral     SpO2 02/20/24 1450 98 %     Weight --      Height --      Head Circumference --      Peak Flow --      Pain Score 02/20/24 1448 2     Pain Loc --      Pain Education --      Exclude from Growth Chart --    No data found.  Updated Vital Signs BP 114/72 (BP Location: Right Arm)   Pulse 71   Temp 99 F (37.2 C) (Oral)   Resp 16   LMP 02/03/2024 (Exact Date)   SpO2 98%   Visual Acuity Right Eye Distance:   Left Eye Distance:   Bilateral Distance:    Right Eye Near:   Left Eye Near:    Bilateral Near:     Physical Exam Vitals and nursing note reviewed.  Constitutional:      General: She is not in acute distress.    Appearance: She is well-developed. She is not ill-appearing, toxic-appearing or diaphoretic.  HENT:     Head: Normocephalic and atraumatic.     Right Ear: Hearing, tympanic membrane, ear canal and external ear normal.     Left Ear: Hearing, tympanic membrane, ear canal and external ear normal.     Nose: Mucosal edema, congestion and rhinorrhea present. Rhinorrhea is purulent.     Right Sinus: Maxillary sinus tenderness and frontal sinus tenderness present.     Left Sinus: Maxillary sinus tenderness and frontal sinus tenderness present.     Mouth/Throat:     Lips: Pink.     Mouth: Mucous membranes are moist.     Pharynx: Uvula midline. No oropharyngeal exudate or posterior oropharyngeal erythema.     Tonsils: No tonsillar exudate.  Eyes:     Conjunctiva/sclera: Conjunctivae normal.     Pupils: Pupils are equal, round, and reactive to light.  Cardiovascular:     Rate and Rhythm: Normal rate and regular rhythm.     Heart sounds: S1 normal and S2 normal. No murmur heard. Pulmonary:     Effort: Pulmonary effort is normal. No respiratory distress.      Breath sounds: Normal breath sounds. No decreased breath sounds, wheezing, rhonchi or rales.  Abdominal:     General: Bowel sounds are normal.     Palpations: Abdomen is soft.     Tenderness: There is no abdominal tenderness.  Musculoskeletal:        General: No swelling.  Cervical back: Neck supple.  Lymphadenopathy:     Head:     Right side of head: Tonsillar adenopathy present. No submental, submandibular, preauricular or posterior auricular adenopathy.     Left side of head: Tonsillar adenopathy present. No submental, submandibular, preauricular or posterior auricular adenopathy.     Cervical: Cervical adenopathy present.     Right cervical: Superficial cervical adenopathy present.     Left cervical: Superficial cervical adenopathy present.  Skin:    General: Skin is warm and dry.     Capillary Refill: Capillary refill takes less than 2 seconds.     Findings: No rash.  Neurological:     Mental Status: She is alert and oriented to person, place, and time.  Psychiatric:        Mood and Affect: Mood normal.      UC Treatments / Results  Labs (all labs ordered are listed, but only abnormal results are displayed) Labs Reviewed - No data to display  EKG   Radiology No results found.  Procedures Procedures (including critical care time)  Medications Ordered in UC Medications - No data to display  Initial Impression / Assessment and Plan / UC Course  I have reviewed the triage vital signs and the nursing notes.  Pertinent labs & imaging results that were available during my care of the patient were reviewed by me and considered in my medical decision making (see chart for details).  Plan of Care: Sinusitis and sinus pressure: Encouraged sinus rinses with saline solution twice daily.  Fluticasone  nasal spray, 1 spray into each nostril twice daily for nasal congestion.  Doxycycline  100 mg twice daily for 10 days.  Get plenty of fluids and rest.  Provided fluconazole   150 mg 1 now and repeat every 5 to 7 days if needed for vaginal yeast infection due to antibiotic use.  May use up to 4 pills over 3 weeks if needed.  Follow-up if symptoms do not improve, worsen or new symptoms occur.  I reviewed the plan of care with the patient and/or the patient's guardian.  The patient and/or guardian had time to ask questions and acknowledged that the questions were answered.  I provided instruction on symptoms or reasons to return here or to go to an ER, if symptoms/condition did not improve, worsened or if new symptoms occurred.  Final Clinical Impressions(s) / UC Diagnoses   Final diagnoses:  Acute non-recurrent pansinusitis  Sinus pain     Discharge Instructions      Sinusitis with sinus pain and a history of yeast infections vaginally after antibiotic use: Encouraged sinus rinses with saline solution twice daily.  Fluticasone  nasal spray, 1 spray into each nostril twice daily for nasal congestion.  Doxycycline  100 mg twice daily for 10 days.  Get plenty of fluids and rest.  Fluconazole , 150 mg 1 pill now and repeat every 5 to 7 days if needed for vaginal yeast infection.  May use up to 4 pills over 3 weeks.  Follow-up if symptoms do not improve, worsen or new symptoms occur.       ED Prescriptions     Medication Sig Dispense Auth. Provider   fluticasone  (FLONASE ) 50 MCG/ACT nasal spray Place 1 spray into both nostrils 2 (two) times daily as needed for rhinitis. 17 mL Ival Domino, FNP   doxycycline  (VIBRAMYCIN ) 100 MG capsule Take 1 capsule (100 mg total) by mouth 2 (two) times daily for 10 days. 20 capsule Ival Domino, FNP   fluconazole  (DIFLUCAN ) 150  MG tablet By mouth today and repeat every 5 days for vaginal yeast infection.  May use up to four pills, if needed 4 tablet Ival Domino, FNP      PDMP not reviewed this encounter.   Ival Domino, FNP 02/20/24 [id number]

## 2024-02-20 NOTE — Discharge Instructions (Signed)
 Sinusitis with sinus pain and a history of yeast infections vaginally after antibiotic use: Encouraged sinus rinses with saline solution twice daily.  Fluticasone  nasal spray, 1 spray into each nostril twice daily for nasal congestion.  Doxycycline  100 mg twice daily for 10 days.  Get plenty of fluids and rest.  Fluconazole , 150 mg 1 pill now and repeat every 5 to 7 days if needed for vaginal yeast infection.  May use up to 4 pills over 3 weeks.  Follow-up if symptoms do not improve, worsen or new symptoms occur.

## 2024-02-20 NOTE — ED Triage Notes (Addendum)
 Sinus infection Sinus pressure Cough Blood whenever she blows her nose No fever

## 2024-07-17 ENCOUNTER — Ambulatory Visit (HOSPITAL_BASED_OUTPATIENT_CLINIC_OR_DEPARTMENT_OTHER)
Admission: RE | Admit: 2024-07-17 | Discharge: 2024-07-17 | Disposition: A | Discharge status: Home / Self Care | Source: Ambulatory Visit | Attending: Nurse Practitioner | Admitting: Nurse Practitioner

## 2024-07-17 ENCOUNTER — Encounter (HOSPITAL_BASED_OUTPATIENT_CLINIC_OR_DEPARTMENT_OTHER): Payer: Self-pay

## 2024-07-17 VITALS — BP 132/79 | HR 74 | Temp 98.2°F | Resp 20

## 2024-07-17 DIAGNOSIS — H6502 Acute serous otitis media, left ear: Secondary | ICD-10-CM | POA: Diagnosis not present

## 2024-07-17 DIAGNOSIS — H9202 Otalgia, left ear: Secondary | ICD-10-CM

## 2024-07-17 DIAGNOSIS — J3089 Other allergic rhinitis: Secondary | ICD-10-CM | POA: Diagnosis not present

## 2024-07-17 MED ORDER — FLUTICASONE PROPIONATE 50 MCG/ACT NA SUSP: 2.0000 | Freq: Every day | NASAL | 0 refills | Status: AC

## 2024-07-17 MED ORDER — CETIRIZINE-PSEUDOEPHEDRINE ER 5-120 MG PO TB12: 1.0000 | ORAL_TABLET | Freq: Every day | ORAL | 0 refills | Status: AC

## 2024-07-17 MED ORDER — PREDNISONE 20 MG PO TABS: 40.0000 mg | ORAL_TABLET | Freq: Every day | ORAL | 0 refills | Status: AC

## 2024-07-17 NOTE — Discharge Instructions (Signed)
 You were seen today for symptoms related to fluid buildup behind the eardrum, also called serous otitis media. This is most often caused by congestion from allergies, sinusitis or a recent upper respiratory illness and usually improves as the swelling and fluid resolve. You have been prescribed cetirizine -pseudoephedrine  to reduce allergy  symptoms and nasal congestion, Flonase  nasal spray to decrease inflammation and help open the drainage pathways from the ear, and a short course of prednisone  to further reduce swelling and promote fluid clearance. Take all medications exactly as directed.  For symptom relief, stay well hydrated, rest as needed, and use over-the-counter pain relievers such as acetaminophen  or ibuprofen  for ear discomfort or headache unless you have been told not to take these. Applying a warm compress to the affected ear may help soothe pain and pressure. Sleeping with your head elevated and using a humidifier can help improve nasal drainage. Avoid inserting anything into the ear and avoid flying or rapid altitude changes if possible until symptoms improve.   Follow up with your primary care provider if symptoms have not noticeably improved within several days, if fullness or hearing changes persist after completing treatment, or if symptoms frequently return. Referral to an ENT specialist may be needed for ongoing fluid buildup, hearing issues, or recurrent ear problems. Seek emergency care if you develop severe or worsening ear pain, high fever, drainage of pus or blood from the ear, sudden hearing loss, significant dizziness, weakness or numbness, facial drooping, confusion, or any other rapidly worsening or concerning symptoms.

## 2024-07-17 NOTE — ED Triage Notes (Signed)
 Pt c/o left ear pain that radiates to her jaw and the side of her head. She has slight soreness to her right ear but nothing as bad as the left.

## 2024-07-17 NOTE — ED Provider Notes (Signed)
 " [name]    CSN: [id number] Arrival date & time: 07/17/24  1714      History   Chief Complaint Chief Complaint  Patient presents with   Otalgia    HPI Kari Greer is a 29 y.o. female.   Discussed the use of AI scribe software for clinical note transcription with the patient, who gave verbal consent to proceed.   Kari Greer presents with left ear pain that started yesterday and has worsened today. The pain is described as occasionally having a stabbing quality. The ear feels muffled, and she reports associated headache. The right ear feels okay for the most part. She denies dizziness and ringing in the ear.  The patient also reports having a cough and some drainage that has been consistent for her, particularly with weather changes, ongoing for a few weeks. She states she has regular allergies, not seasonal. She denies fever. Due to the ear pain, she was unable to wear her usual earbuds today while working from home on her computer all day. She does not smoke or vape.   The following sections of the patient's history were reviewed and updated as appropriate: allergies, current medications, past family history, past medical history, past social history, past surgical history, and problem list.     Past Medical History:  Diagnosis Date   Wears contact lenses     There are no active problems to display for this patient.   Past Surgical History:  Procedure Laterality Date   LAPAROSCOPIC TUBAL LIGATION Bilateral 04/10/2021   Procedure: LAPAROSCOPIC TUBAL LIGATION;  Surgeon: Gretta Gums, MD;  Location: [facility] [place];  Service: Gynecology;  Laterality: Bilateral;  with filshie clips    OB History   No obstetric history on file.      Home Medications    Prior to Admission medications  Medication Sig Start Date End Date Taking? Authorizing Provider  cetirizine -pseudoephedrine  (ZYRTEC -D) 5-120 MG tablet Take 1 tablet by mouth daily with  breakfast for 10 days. 07/17/24 07/27/24 Yes Iola Lukes, FNP  fluticasone  (FLONASE ) 50 MCG/ACT nasal spray Place 2 sprays into both nostrils daily. Shake well before use. Gently blow nose before spraying. Do not blow nose immediately after use. You should not taste the medication or feel it going down your throat; if you do, adjust your technique. 07/17/24  Yes Baudelia Schroepfer, FNP  predniSONE  (DELTASONE ) 20 MG tablet Take 2 tablets (40 mg total) by mouth daily for 5 days. 07/17/24 07/22/24 Yes Marybeth Dandy, FNP  buPROPion (WELLBUTRIN XL) 150 MG 24 hr tablet Take 150 mg by mouth at bedtime.    [provider]    Family History History reviewed. No pertinent family history.  Social History Social History[1]   Allergies   Penicillins   Review of Systems Review of Systems  Constitutional:  Negative for fever.  HENT:  Positive for congestion, ear pain and postnasal drip. Negative for sneezing and tinnitus.   Respiratory:  Positive for cough.   Gastrointestinal:  Negative for diarrhea, nausea and vomiting.  Neurological:  Positive for headaches. Negative for dizziness.  All other systems reviewed and are negative.    Physical Exam Triage Vital Signs ED Triage Vitals  Encounter Vitals Group     BP 07/17/24 1742 132/79     Girls Systolic BP Percentile --      Girls Diastolic BP Percentile --      Boys Systolic BP Percentile --      Boys Diastolic BP Percentile --  Pulse Rate 07/17/24 1742 74     Resp 07/17/24 1742 20     Temp 07/17/24 1742 98.2 F (36.8 C)     Temp Source 07/17/24 1742 Oral     SpO2 07/17/24 1742 99 %     Weight --      Height --      Head Circumference --      Peak Flow --      Pain Score 07/17/24 1739 6     Pain Loc --      Pain Education --      Exclude from Growth Chart --    No data found.  Updated Vital Signs BP 132/79 (BP Location: Right Arm)   Pulse 74   Temp 98.2 F (36.8 C) (Oral)   Resp 20   LMP 06/27/2024  (Approximate)   SpO2 99%   Visual Acuity Right Eye Distance:   Left Eye Distance:   Bilateral Distance:    Right Eye Near:   Left Eye Near:    Bilateral Near:     Physical Exam Vitals reviewed.  Constitutional:      General: She is awake. She is not in acute distress.    Appearance: Normal appearance. She is well-developed. She is not ill-appearing, toxic-appearing or diaphoretic.  HENT:     Head: Normocephalic.     Right Ear: Hearing, ear canal and external ear normal. No drainage, swelling or tenderness. A middle ear effusion is present. Tympanic membrane is not erythematous.     Left Ear: Hearing, ear canal and external ear normal. No drainage, swelling or tenderness. A middle ear effusion is present. Tympanic membrane is not erythematous.     Nose: Congestion present.     Mouth/Throat:     Lips: Pink.     Mouth: Mucous membranes are moist.     Pharynx: Uvula midline. No pharyngeal swelling, oropharyngeal exudate, posterior oropharyngeal erythema or uvula swelling.     Tonsils: No tonsillar exudate or tonsillar abscesses.  Eyes:     General: Vision grossly intact.     Conjunctiva/sclera: Conjunctivae normal.  Cardiovascular:     Rate and Rhythm: Normal rate and regular rhythm.     Heart sounds: Normal heart sounds.  Pulmonary:     Effort: Pulmonary effort is normal. No tachypnea or respiratory distress.     Breath sounds: Normal breath sounds and air entry.     Comments: Respirations even and unlabored  Musculoskeletal:        General: Normal range of motion.     Cervical back: Full passive range of motion without pain, normal range of motion and neck supple.  Lymphadenopathy:     Cervical: No cervical adenopathy.  Skin:    General: Skin is warm and dry.  Neurological:     General: No focal deficit present.     Mental Status: She is alert and oriented to person, place, and time.  Psychiatric:        Speech: Speech normal.        Behavior: Behavior is cooperative.       UC Treatments / Results  Labs (all labs ordered are listed, but only abnormal results are displayed) Labs Reviewed - No data to display  EKG   Radiology No results found.  Procedures Procedures (including critical care time)  Medications Ordered in UC Medications - No data to display  Initial Impression / Assessment and Plan / UC Course  I have reviewed the triage vital signs and  the nursing notes.  Pertinent labs & imaging results that were available during my care of the patient were reviewed by me and considered in my medical decision making (see chart for details).     The patient presents with symptoms consistent with acute serous otitis media. Evaluation is most consistent with middle ear effusion likely due to chronic allergies without evidence of acute bacterial infection. Treatment was initiated with cetirizine -pseudoephedrine  for antihistamine and decongestant support, fluticasone  nasal spray to reduce nasal and eustachian tube inflammation, and a short course of prednisone  to decrease mucosal edema and promote drainage. Supportive care measures were reviewed, including adequate hydration, rest, use of warm compresses for ear discomfort, and over-the-counter analgesics as needed for pain control. The patient was advised to follow up with their primary care provider for reassessment if symptoms do not improve or resolve and to consider ENT referral for persistent or recurrent effusion or hearing concerns. Emergency evaluation was advised for worsening pain, development of high fever, new hearing changes, dizziness, ear drainage, or any concerning neurological symptoms.  Today's evaluation has revealed no signs of a dangerous process. Discussed diagnosis with patient and/or guardian. Patient and/or guardian aware of their diagnosis, possible red flag symptoms to watch out for and need for close follow up. Patient and/or guardian understands verbal and written discharge  instructions. Patient and/or guardian comfortable with plan and disposition.  Patient and/or guardian has a clear mental status at this time, good insight into illness (after discussion and teaching) and has clear judgment to make decisions regarding their care  Documentation was completed with the aid of voice recognition software. Transcription may contain typographical errors.   Final Clinical Impressions(s) / UC Diagnoses   Final diagnoses:  Acute serous otitis media without rupture, left  Acute otalgia, left  Non-seasonal allergic rhinitis, unspecified trigger     Discharge Instructions      You were seen today for symptoms related to fluid buildup behind the eardrum, also called serous otitis media. This is most often caused by congestion from allergies, sinusitis or a recent upper respiratory illness and usually improves as the swelling and fluid resolve. You have been prescribed cetirizine -pseudoephedrine  to reduce allergy symptoms and nasal congestion, Flonase  nasal spray to decrease inflammation and help open the drainage pathways from the ear, and a short course of prednisone  to further reduce swelling and promote fluid clearance. Take all medications exactly as directed.  For symptom relief, stay well hydrated, rest as needed, and use over-the-counter pain relievers such as acetaminophen  or ibuprofen  for ear discomfort or headache unless you have been told not to take these. Applying a warm compress to the affected ear may help soothe pain and pressure. Sleeping with your head elevated and using a humidifier can help improve nasal drainage. Avoid inserting anything into the ear and avoid flying or rapid altitude changes if possible until symptoms improve. Follow up with your primary care provider if symptoms have not noticeably improved within several days, if fullness or hearing changes persist after completing treatment, or if symptoms frequently return. Referral to an ENT specialist  may be needed for ongoing fluid buildup, hearing issues, or recurrent ear problems. Seek emergency care if you develop severe or worsening ear pain, high fever, drainage of pus or blood from the ear, sudden hearing loss, significant dizziness, weakness or numbness, facial drooping, confusion, or any other rapidly worsening or concerning symptoms.      ED Prescriptions     Medication Sig Dispense Auth. Provider  cetirizine -pseudoephedrine  (ZYRTEC -D) 5-120 MG tablet Take 1 tablet by mouth daily with breakfast for 10 days. 10 tablet Iola Lukes, FNP   fluticasone  (FLONASE ) 50 MCG/ACT nasal spray Place 2 sprays into both nostrils daily. Shake well before use. Gently blow nose before spraying. Do not blow nose immediately after use. You should not taste the medication or feel it going down your throat; if you do, adjust your technique. 16 g Iola Lukes, FNP   predniSONE  (DELTASONE ) 20 MG tablet Take 2 tablets (40 mg total) by mouth daily for 5 days. 10 tablet Iola Lukes, FNP      PDMP not reviewed this encounter.     [1]  Social History Tobacco Use   Smoking status: Never   Smokeless tobacco: Never  Vaping Use   Vaping status: Never Used  Substance Use Topics   Alcohol use: Yes    Comment: occasional   Drug use: Never     Iola Lukes, FNP 07/17/24 1912  "

## 2024-08-16 ENCOUNTER — Ambulatory Visit (HOSPITAL_BASED_OUTPATIENT_CLINIC_OR_DEPARTMENT_OTHER)
Admission: RE | Admit: 2024-08-16 | Discharge: 2024-08-16 | Disposition: A | Discharge status: Home / Self Care | Source: Home / Self Care

## 2024-08-16 ENCOUNTER — Ambulatory Visit (HOSPITAL_BASED_OUTPATIENT_CLINIC_OR_DEPARTMENT_OTHER): Admit: 2024-08-16 | Discharge: 2024-08-16 | Disposition: A | Admitting: Radiology

## 2024-08-16 ENCOUNTER — Encounter (HOSPITAL_BASED_OUTPATIENT_CLINIC_OR_DEPARTMENT_OTHER): Payer: Self-pay

## 2024-08-16 DIAGNOSIS — M25562 Pain in left knee: Secondary | ICD-10-CM | POA: Diagnosis not present

## 2024-08-16 DIAGNOSIS — M25561 Pain in right knee: Secondary | ICD-10-CM | POA: Diagnosis not present

## 2024-08-16 NOTE — Discharge Instructions (Addendum)
 No concerns on xrays. Most likely sprained Rest, Ice, Elevate, compression to both knees.  Ibuprofen  for pain and inflammation.  See ortho for continued issues.

## 2024-08-16 NOTE — ED Provider Notes (Signed)
 " [name]    CSN: [id number] Arrival date & time: 08/16/24  1718      History   Chief Complaint Chief Complaint  Patient presents with   Knee Injury    I slipped on ice on the 30th of January causing both of my legs to bend outwards to the sides at my knees. I have continued to have pain with not much change. Today I was walking my dog and pulled my right knee again causing more pain. - Entered by patient    HPI Arali Somera Cubit is a 29 y.o. female.   Pt is a 29 year old female that presents with bilateral knee pain. Patient slipped on ice approx 1 week ago. Legs slipped outward at knee. States slipped again today while walking dog and caused more pain to left knee. Would like both knees x-ray'd. Patient has not taken anything for pain today. Tylenol  taken yesterday. States pain to lateral sides of knees with radiation down side of lower leg.      Past Medical History:  Diagnosis Date   Wears contact lenses     There are no active problems to display for this patient.   Past Surgical History:  Procedure Laterality Date   LAPAROSCOPIC TUBAL LIGATION Bilateral 04/10/2021   Procedure: LAPAROSCOPIC TUBAL LIGATION;  Surgeon: Gretta Gums, MD;  Location: [facility] [place];  Service: Gynecology;  Laterality: Bilateral;  with filshie clips    OB History   No obstetric history on file.      Home Medications    Prior to Admission medications  Medication Sig Start Date End Date Taking? Authorizing Provider  buPROPion (WELLBUTRIN XL) 150 MG 24 hr tablet Take 150 mg by mouth at bedtime.    [provider]  fluticasone  (FLONASE ) 50 MCG/ACT nasal spray Place 2 sprays into both nostrils daily. Shake well before use. Gently blow nose before spraying. Do not blow nose immediately after use. You should not taste the medication or feel it going down your throat; if you do, adjust your technique. 07/17/24   Iola Lukes, FNP    Family History History  reviewed. No pertinent family history.  Social History Social History[1]   Allergies   Penicillins   Review of Systems Review of Systems See HPI  Physical Exam Triage Vital Signs ED Triage Vitals [08/16/24 1742]  Encounter Vitals Group     BP      Girls Systolic BP Percentile      Girls Diastolic BP Percentile      Boys Systolic BP Percentile      Boys Diastolic BP Percentile      Pulse      Resp      Temp      Temp src      SpO2      Weight      Height      Head Circumference      Peak Flow      Pain Score 4     Pain Loc      Pain Education      Exclude from Growth Chart    No data found.  Updated Vital Signs LMP 08/02/2024 (Approximate)   Visual Acuity Right Eye Distance:   Left Eye Distance:   Bilateral Distance:    Right Eye Near:   Left Eye Near:    Bilateral Near:     Physical Exam Vitals and nursing note reviewed.  Constitutional:  General: She is not in acute distress.    Appearance: Normal appearance. She is not ill-appearing, toxic-appearing or diaphoretic.  Pulmonary:     Effort: Pulmonary effort is normal.  Musculoskeletal:        General: Swelling, tenderness and signs of injury present. Normal range of motion.     Left knee: Swelling present. No deformity or erythema. Normal range of motion. Tenderness present over the LCL.       Legs:  Neurological:     Mental Status: She is alert.  Psychiatric:        Mood and Affect: Mood normal.      UC Treatments / Results  Labs (all labs ordered are listed, but only abnormal results are displayed) Labs Reviewed - No data to display  EKG   Radiology DG Knee Complete 4 Views Left Result Date: 08/16/2024 CLINICAL DATA:  Fall, knee pain EXAM: LEFT KNEE - COMPLETE 4+ VIEW COMPARISON:  08/16/2024 FINDINGS: No evidence of fracture, dislocation, or joint effusion. No evidence of arthropathy or other focal bone abnormality. Soft tissues are unremarkable. IMPRESSION: No acute finding by  plain radiography Electronically Signed   By: CHRISTELLA.  Shick M.D.   On: 08/16/2024 18:04   DG Knee Complete 4 Views Right Result Date: 08/16/2024 CLINICAL DATA:  Recent fall, knee pain EXAM: RIGHT KNEE - COMPLETE 4+ VIEW COMPARISON:  08/16/2024 FINDINGS: No evidence of fracture, dislocation, or joint effusion. No evidence of arthropathy or other focal bone abnormality. Soft tissues are unremarkable. IMPRESSION: No acute finding by plain radiography Electronically Signed   By: CHRISTELLA.  Shick M.D.   On: 08/16/2024 18:03    Procedures Procedures (including critical care time)  Medications Ordered in UC Medications - No data to display  Initial Impression / Assessment and Plan / UC Course  I have reviewed the triage vital signs and the nursing notes.  Pertinent labs & imaging results that were available during my care of the patient were reviewed by me and considered in my medical decision making (see chart for details).     Acute knee pain- No concerns on xrays. Most likely sprained Rest, Ice, Elevate, compression to both knees.  Ibuprofen  for pain and inflammation.  See ortho for continued issues.  Final Clinical Impressions(s) / UC Diagnoses   Final diagnoses:  Acute pain of both knees     Discharge Instructions      No concerns on xrays. Most likely sprained Rest, Ice, Elevate, compression to both knees.  Ibuprofen  for pain and inflammation.  See ortho for continued issues.      ED Prescriptions   None    PDMP not reviewed this encounter.     [1]  Social History Tobacco Use   Smoking status: Never   Smokeless tobacco: Never  Vaping Use   Vaping status: Never Used  Substance Use Topics   Alcohol use: Yes    Comment: occasional   Drug use: Never     Adah Wilbert LABOR, FNP 08/16/24 1826  "

## 2024-08-16 NOTE — ED Triage Notes (Signed)
 Patient slipped on ice approx 1 week ago. Legs slipped outward at knee. States slipped again today while walking dog and caused more pain to left knee. Would like both knees x-ray'd. Patient has not taken anything for pain today. Tylenol  taken yesterday. States pain to lateral sides of knees with radiation down side of lower leg.
